# Patient Record
Sex: Male | Born: 1995 | Race: Black or African American | Hispanic: No | Marital: Single | Smoking: Current every day smoker
Health system: Southern US, Community
[De-identification: ages and names within clinical notes are randomized; demographics above are authoritative.]

## PROBLEM LIST (undated history)

## (undated) DIAGNOSIS — G43909 Migraine, unspecified, not intractable, without status migrainosus: Secondary | ICD-10-CM

---
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
## 2015-08-01 ENCOUNTER — Encounter: Payer: Self-pay | Admitting: Emergency Medicine

## 2015-08-01 ENCOUNTER — Emergency Department
Admission: EM | Admit: 2015-08-01 | Discharge: 2015-08-01 | Disposition: A | Discharge status: Home / Self Care | Payer: Medicaid Other | Attending: Emergency Medicine | Admitting: Emergency Medicine

## 2015-08-01 DIAGNOSIS — R51 Headache: Secondary | ICD-10-CM | POA: Diagnosis present

## 2015-08-01 DIAGNOSIS — R062 Wheezing: Secondary | ICD-10-CM | POA: Insufficient documentation

## 2015-08-01 DIAGNOSIS — G44229 Chronic tension-type headache, not intractable: Secondary | ICD-10-CM

## 2015-08-01 DIAGNOSIS — Z720 Tobacco use: Secondary | ICD-10-CM | POA: Insufficient documentation

## 2015-08-01 DIAGNOSIS — L309 Dermatitis, unspecified: Secondary | ICD-10-CM

## 2015-08-01 MED ORDER — IMITREX 50 MG PO TABS: 50.0000 mg | ORAL_TABLET | Freq: Once | ORAL | Status: DC | PRN

## 2015-08-01 MED ORDER — TRIAMCINOLONE ACETONIDE 0.5 % EX OINT: 1.0000 "application " | TOPICAL_OINTMENT | Freq: Two times a day (BID) | CUTANEOUS | Status: DC

## 2015-08-01 NOTE — ED Notes (Signed)
Pt to ed with c/o headache that has been occuring daily x 2 weeks.  Pt states he is able to take IBU and the pain resolves but [facility] that he should come to er for eval.  Pt denies headache at this time.  Alert and oriented, skin warm and dry and pt appears in nad.

## 2015-08-01 NOTE — ED Provider Notes (Signed)
CSN: [id number]     Arrival date & time 08/01/15  1237 History   First MD Initiated Contact with Patient 08/01/15 1253     Chief Complaint  Patient presents with  . Migraine    HPI Comments: 19 year old male presents today complaining of headaches off and on for the past 2 weeks. He is currently residing in a homeless shelter and his sponsor recommended he come to the hospital to be evaluated. He reports the headaches start in his occiput and also affect his temples at times. The pain is throbbing but is relieved by ibuprofen. He does not have a headache at present. He also experiences photophobia with these headaches, no nausea, vomiting or phonophobia. He has never been treated for headaches in the past.  Patient also concerned about eczema he has had since he is a child. The only thing he is applying to it currently is Vaseline. He requested prescription for the rash that is on his neck and bilateral arms.  Patient is a 19 y.o. male presenting with migraines.  Migraine Associated symptoms include headaches and a rash. Pertinent negatives include no fever, neck pain or weakness.    History reviewed. No pertinent past medical history. History reviewed. No pertinent past surgical history. History reviewed. No pertinent family history. Social History  Substance Use Topics  . Smoking status: Current Every Day Smoker  . Smokeless tobacco: None  . Alcohol Use: No    Review of Systems  Constitutional: Negative for fever.  Eyes: Positive for photophobia. Negative for pain.  Musculoskeletal: Negative for neck pain and neck stiffness.  Skin: Positive for rash.  Neurological: Positive for headaches. Negative for dizziness, weakness and light-headedness.  All other systems reviewed and are negative.     Allergies  Review of patient's allergies indicates no known allergies.  Home Medications   Prior to Admission medications   Medication Sig Start Date End Date Taking? Authorizing  Provider  IMITREX 50 MG tablet Take 1 tablet (50 mg total) by mouth once as needed for migraine. May repeat in 2 hours if headache persists or recurs. Max 2 doses in one day 08/01/15 07/31/16  Luvenia Redden, PA-C  triamcinolone ointment (KENALOG) 0.5 % Apply 1 application topically 2 (two) times daily. 08/01/15   Wilber Oliphant V, PA-C   BP 119/42 mmHg  Pulse 91  Temp(Src) 98.4 F (36.9 C) (Oral)  Resp 20  Ht  (1.651 m)  Wt 116 lb (52.617 kg)  BMI 19.30 kg/m2  SpO2 100% Physical Exam  Constitutional: He is oriented to person, place, and time. Vital signs are normal. He appears well-developed and well-nourished. He is active.  Non-toxic appearance. He does not have a sickly appearance. He does not appear ill.  HENT:  Head: Normocephalic and atraumatic.  Right Ear: Tympanic membrane and external ear normal.  Left Ear: Tympanic membrane and external ear normal.  Nose: Nose normal.  Mouth/Throat: Uvula is midline, oropharynx is clear and moist and mucous membranes are normal.  Eyes: Conjunctivae and EOM are normal. Pupils are equal, round, and reactive to light.  No photophobia  Neck: Trachea normal, normal range of motion, full passive range of motion without pain and phonation normal. Neck supple. No spinous process tenderness and no muscular tenderness present. No Brudzinski's sign and no Kernig's sign noted.  Cardiovascular: Normal rate, regular rhythm, normal heart sounds and intact distal pulses.   Pulmonary/Chest: Effort normal. He has wheezes. He has no rales. He exhibits no tenderness.  Musculoskeletal:  Normal range of motion.  Neurological: He is alert and oriented to person, place, and time.  Skin: Skin is warm and dry. Rash noted.  Erythematous, dry and peeling rash noted to neck,bilateral antecubital fossa  Psychiatric: He has a normal mood and affect. His behavior is normal. Judgment and thought content normal.  Nursing note and vitals reviewed.   ED Course  Procedures  (including critical care time) Labs Review Labs Reviewed - No data to display  Imaging Review No results found. I have personally reviewed and evaluated these images and lab results as part of my medical decision-making.   EKG Interpretation None      MDM  Headaches sound like tension headache. May be due to positioning/posturing. Can continue motrin if this is effective. RX for imitrex  BID as needed in addition to motrin. Triamcinolone BId to rash as needed.  Final diagnoses:  Eczema  Chronic tension-type headache, not intractable      Luvenia Redden, PA-C 08/01/15 1327  Myrna Blazer, MD 08/01/15 1531

## 2017-01-01 ENCOUNTER — Emergency Department
Admission: EM | Admit: 2017-01-01 | Discharge: 2017-01-01 | Disposition: A | Discharge status: Home / Self Care | Payer: Medicaid Other | Attending: Emergency Medicine | Admitting: Emergency Medicine

## 2017-01-01 ENCOUNTER — Encounter: Payer: Self-pay | Admitting: Emergency Medicine

## 2017-01-01 DIAGNOSIS — F17200 Nicotine dependence, unspecified, uncomplicated: Secondary | ICD-10-CM | POA: Insufficient documentation

## 2017-01-01 DIAGNOSIS — K2900 Acute gastritis without bleeding: Secondary | ICD-10-CM | POA: Insufficient documentation

## 2017-01-01 DIAGNOSIS — Z79899 Other long term (current) drug therapy: Secondary | ICD-10-CM | POA: Insufficient documentation

## 2017-01-01 LAB — CBC
HEMATOCRIT: 40.4 % (ref 40.0–52.0)
Hemoglobin: 13.9 g/dL (ref 13.0–18.0)
MCH: 30.9 pg (ref 26.0–34.0)
MCHC: 34.4 g/dL (ref 32.0–36.0)
MCV: 89.7 fL (ref 80.0–100.0)
Platelets: 219 10*3/uL (ref 150–440)
RBC: 4.51 MIL/uL (ref 4.40–5.90)
RDW: 13.6 % (ref 11.5–14.5)
WBC: 3.9 10*3/uL (ref 3.8–10.6)

## 2017-01-01 LAB — COMPREHENSIVE METABOLIC PANEL
ALT: 10 U/L — ABNORMAL LOW (ref 17–63)
AST: 18 U/L (ref 15–41)
Albumin: 4.2 g/dL (ref 3.5–5.0)
Alkaline Phosphatase: 51 U/L (ref 38–126)
Anion gap: 7 (ref 5–15)
BILIRUBIN TOTAL: 0.9 mg/dL (ref 0.3–1.2)
BUN: 14 mg/dL (ref 6–20)
CO2: 24 mmol/L (ref 22–32)
Calcium: 9 mg/dL (ref 8.9–10.3)
Chloride: 108 mmol/L (ref 101–111)
Creatinine, Ser: 0.9 mg/dL (ref 0.61–1.24)
Glucose, Bld: 93 mg/dL (ref 65–99)
POTASSIUM: 3.6 mmol/L (ref 3.5–5.1)
Sodium: 139 mmol/L (ref 135–145)
TOTAL PROTEIN: 6.6 g/dL (ref 6.5–8.1)

## 2017-01-01 LAB — LIPASE, BLOOD: LIPASE: 41 U/L (ref 11–51)

## 2017-01-01 MED ORDER — ONDANSETRON 4 MG PO TBDP: 4.0000 mg | ORAL_TABLET | Freq: Three times a day (TID) | ORAL | 0 refills | Status: DC | PRN

## 2017-01-01 MED ORDER — ONDANSETRON 4 MG PO TBDP
4.0000 mg | ORAL_TABLET | Freq: Once | ORAL | Status: AC
  Administered 2017-01-01: 4 mg via ORAL
  Filled 2017-01-01: qty 1

## 2017-01-01 NOTE — ED Notes (Signed)
AAOx3.  Skin warm and dry.  Patient states "I feel a little better".

## 2017-01-01 NOTE — ED Provider Notes (Signed)
[facility] Emergency Department Provider Note   ____________________________________________    I have reviewed the triage vital signs and the nursing notes.   HISTORY  Chief Complaint Nausea and vomiting    HPI Craig Ritter is a 21 y.o. male who presents with nausea vomiting and mild epigastric discomfort. Patient reports yesterday morning he felt nauseous after eating breakfast, he vomited and then felt better for most of the day. This morning he woke up again feeling nauseous and has vomited 4 times this morning. He complains of mild epigastric discomfort. Does report taking 2 Advil yesterday. Does not drink daily. Does smoke cigarettes.   History reviewed. No pertinent past medical history.  There are no active problems to display for this patient.   History reviewed. No pertinent surgical history.  Prior to Admission medications   Medication Sig Start Date End Date Taking? Authorizing Provider  IMITREX 50 MG tablet Take 1 tablet (50 mg total) by mouth once as needed for migraine. May repeat in 2 hours if headache persists or recurs. Max 2 doses in one day 08/01/15 07/31/16  Christella Scheuermann, PA-C  ondansetron (ZOFRAN ODT) 4 MG disintegrating tablet Take 1 tablet (4 mg total) by mouth every 8 (eight) hours as needed for nausea or vomiting. 01/01/17   Jene Every, MD  triamcinolone ointment (KENALOG) 0.5 % Apply 1 application topically 2 (two) times daily. 08/01/15   Christella Scheuermann, PA-C     Allergies Patient has no known allergies.  No family history on file.  Social History Social History  Substance Use Topics  . Smoking status: Current Every Day Smoker  . Smokeless tobacco: Never Used  . Alcohol use No    Review of Systems  Constitutional: No fever/chills Eyes: No visual changes.  ENT: No sore throat. Cardiovascular: Denies chest pain. Respiratory: Denies shortness of breath. Gastrointestinal: As above . Musculoskeletal:  Negative for back pain. Skin: Negative for rash. Neurological: Negative for headaches   10-point ROS otherwise negative.  ____________________________________________   PHYSICAL EXAM:  VITAL SIGNS: ED Triage Vitals  Enc Vitals Group     BP 01/01/17 0655 110/65     Pulse Rate 01/01/17 0655 72     Resp 01/01/17 0655 18     Temp 01/01/17 0655 97.5 F (36.4 C)     Temp Source 01/01/17 0655 Oral     SpO2 01/01/17 0655 100 %     Weight 01/01/17 0655 122 lb (55.3 kg)     Height 01/01/17 0655 5\' 4"  (1.626 m)     Head Circumference --      Peak Flow --      Pain Score 01/01/17 0656 6     Pain Loc --      Pain Edu? --      Excl. in GC? --     Constitutional: Alert and oriented. No acute distress. Pleasant and interactive Eyes: Conjunctivae are normal.   Nose: No congestion/rhinnorhea. Mouth/Throat: Mucous membranes are moist.    Cardiovascular: Normal rate, regular rhythm. Grossly normal heart sounds.  Good peripheral circulation. Respiratory: Normal respiratory effort.  No retractions. Lungs CTAB. Gastrointestinal: Soft and nontender. No distention.  No CVA tenderness.  Musculoskeletal: No lower extremity tenderness nor edema.  Warm and well perfused Neurologic:  Normal speech and language. No gross focal neurologic deficits are appreciated.  Skin:  Skin is warm, dry and intact. No rash noted. Psychiatric: Mood and affect are normal. Speech and behavior are normal.  ____________________________________________   LABS (all labs ordered are listed, but only abnormal results are displayed)  Labs Reviewed  COMPREHENSIVE METABOLIC PANEL - Abnormal; Notable for the following:       Result Value   ALT 10 (*)    All other components within normal limits  CBC  LIPASE, BLOOD    ____________________________________________  EKG  None ____________________________________________  RADIOLOGY  None ____________________________________________   PROCEDURES  Procedure(s) performed: No    Critical Care performed: No ____________________________________________   INITIAL IMPRESSION / ASSESSMENT AND PLAN / ED COURSE  Pertinent labs & imaging results that were available during my care of the patient were reviewed by me and considered in my medical decision making (see chart for details).  Patient well-appearing with reassuring exam. No tenderness palpation of the abdomen. No distention. Mild nausea this morning. Will treat with by mouth Zofran, check labs and reevaluate. Suspect gastritis, likely viral.    ----------------------------------------- 9:30 AM on 01/01/2017 -----------------------------------------  Patient reports his nausea has resolved. Lab work is reassuring. Exam remains benign. Recommend outpatient follow-up as needed. Return precautions discussed._ ___________________________________________   FINAL CLINICAL IMPRESSION(S) / ED DIAGNOSES  Final diagnoses:  Acute gastritis without hemorrhage, unspecified gastritis type      NEW MEDICATIONS STARTED DURING THIS VISIT:  Discharge Medication List as of 01/01/2017  9:20 AM    START taking these medications   Details  ondansetron (ZOFRAN ODT) 4 MG disintegrating tablet Take 1 tablet (4 mg total) by mouth every 8 (eight) hours as needed for nausea or vomiting., Starting Wed 01/01/2017, Print         Note:  This document was prepared using Dragon voice recognition software and may include unintentional dictation errors.    Jene Everyobert Finnlee Silvernail, MD 01/01/17 0930

## 2017-01-01 NOTE — ED Notes (Signed)
AAOx3.  Skin warm and dry.  NAD 

## 2017-01-01 NOTE — ED Triage Notes (Addendum)
Patient ambulatory to triage with steady gait, without difficulty or distress noted; pt reports N/V and mid abd pain since eating a banana yesterday; took 2 advil which made pain worse

## 2017-01-01 NOTE — ED Notes (Signed)
Patient denies pain and is resting comfortably.  

## 2019-02-28 ENCOUNTER — Encounter: Payer: Self-pay | Admitting: Emergency Medicine

## 2019-02-28 ENCOUNTER — Emergency Department: Payer: Self-pay

## 2019-02-28 ENCOUNTER — Other Ambulatory Visit: Payer: Self-pay

## 2019-02-28 ENCOUNTER — Emergency Department
Admission: EM | Admit: 2019-02-28 | Discharge: 2019-02-28 | Disposition: A | Discharge status: Home / Self Care | Payer: Self-pay | Attending: Emergency Medicine | Admitting: Emergency Medicine

## 2019-02-28 DIAGNOSIS — R079 Chest pain, unspecified: Secondary | ICD-10-CM | POA: Insufficient documentation

## 2019-02-28 DIAGNOSIS — F17210 Nicotine dependence, cigarettes, uncomplicated: Secondary | ICD-10-CM | POA: Insufficient documentation

## 2019-02-28 DIAGNOSIS — R110 Nausea: Secondary | ICD-10-CM | POA: Insufficient documentation

## 2019-02-28 LAB — CBC
HCT: 44.2 % (ref 39.0–52.0)
Hemoglobin: 15.2 g/dL (ref 13.0–17.0)
MCH: 31 pg (ref 26.0–34.0)
MCHC: 34.4 g/dL (ref 30.0–36.0)
MCV: 90 fL (ref 80.0–100.0)
Platelets: 195 10*3/uL (ref 150–400)
RBC: 4.91 MIL/uL (ref 4.22–5.81)
RDW: 11.9 % (ref 11.5–15.5)
WBC: 4.1 10*3/uL (ref 4.0–10.5)
nRBC: 0 % (ref 0.0–0.2)

## 2019-02-28 LAB — BASIC METABOLIC PANEL
Anion gap: 13 (ref 5–15)
BUN: 11 mg/dL (ref 6–20)
CO2: 25 mmol/L (ref 22–32)
Calcium: 9.6 mg/dL (ref 8.9–10.3)
Chloride: 101 mmol/L (ref 98–111)
Creatinine, Ser: 1.07 mg/dL (ref 0.61–1.24)
GFR calc Af Amer: >60 mL/min (ref >60)
GFR calc non Af Amer: >60 mL/min (ref >60)
Glucose, Bld: 103 mg/dL — ABNORMAL HIGH (ref 70–99)
Potassium: 3.4 mmol/L — ABNORMAL LOW (ref 3.5–5.1)
Sodium: 139 mmol/L (ref 135–145)

## 2019-02-28 LAB — TROPONIN I: Troponin I: <0.03 ng/mL (ref <0.03)

## 2019-02-28 MED ORDER — ONDANSETRON 4 MG PO TBDP
4.0000 mg | ORAL_TABLET | Freq: Once | ORAL | Status: AC | PRN
  Administered 2019-02-28: 4 mg via ORAL
  Filled 2019-02-28: qty 1

## 2019-02-28 MED ORDER — ONDANSETRON 4 MG PO TBDP: 4.0000 mg | ORAL_TABLET | Freq: Three times a day (TID) | ORAL | 0 refills | Status: DC | PRN

## 2019-02-28 NOTE — ED Triage Notes (Signed)
Pt arrives with complaints of midsternum chest pain that pt reports as a tight sensation. Pain started last night and the intensity is intermittent. Pt also reports nausea/diarrhea that started today.

## 2019-02-28 NOTE — ED Provider Notes (Signed)
[facility] Emergency Department Provider Note  Time seen: 1:24 PM  I have reviewed the triage vital signs and the nursing notes.   HISTORY  Chief Complaint Chest Pain    HPI Craig Rorie. is a 23 y.o. male with a past medical history of anxiety, presents to the emergency department for chest discomfort and nausea.  According to the patient since yesterday he has been experiencing intermittent chest discomfort he describes more as a mild tightness sensation.  Also states he has been nauseated since yesterday had one episode of diarrhea today.  Denies any abdominal pain.  No fever cough congestion or shortness of breath.    History reviewed. No pertinent past medical history.  There are no active problems to display for this patient.   History reviewed. No pertinent surgical history.  Prior to Admission medications   Medication Sig Start Date End Date Taking? Authorizing Provider  IMITREX 50 MG tablet Take 1 tablet (50 mg total) by mouth once as needed for migraine. May repeat in 2 hours if headache persists or recurs. Max 2 doses in one day 08/01/15 07/31/16  Christella Scheuermann, PA-C  ondansetron (ZOFRAN ODT) 4 MG disintegrating tablet Take 1 tablet (4 mg total) by mouth every 8 (eight) hours as needed for nausea or vomiting. 01/01/17   Jene Every, MD  triamcinolone ointment (KENALOG) 0.5 % Apply 1 application topically 2 (two) times daily. 08/01/15   Christella Scheuermann, PA-C    No Known Allergies  No family history on file.  Social History Social History   Tobacco Use  . Smoking status: Current Every Day Smoker  . Smokeless tobacco: Never Used  Substance Use Topics  . Alcohol use: No  . Drug use: No    Review of Systems Constitutional: Negative for fever. Cardiovascular: Mild chest tightness. Respiratory: Negative for shortness of breath. Gastrointestinal: Negative for abdominal pain.  Positive for nausea x2 days, one episode of diarrhea.   Negative for vomiting. Genitourinary: Negative for urinary compaints Musculoskeletal: Negative for musculoskeletal complaints Skin: Negative for skin complaints  Neurological: Negative for headache All other ROS negative  ____________________________________________   PHYSICAL EXAM:  VITAL SIGNS: ED Triage Vitals  Enc Vitals Group     BP 02/28/19 1146 (!) 121/55     Pulse Rate 02/28/19 1146 81     Resp 02/28/19 1146 15     Temp 02/28/19 1146 97.8 F (36.6 C)     Temp Source 02/28/19 1146 Oral     SpO2 02/28/19 1146 100 %     Weight 02/28/19 1145 115 lb (52.2 kg)     Height 02/28/19 1145 5\' 6"  (1.676 m)     Head Circumference --      Peak Flow --      Pain Score 02/28/19 1145 5     Pain Loc --      Pain Edu? --      Excl. in GC? --     Constitutional: Alert and oriented. Well appearing and in no distress. Eyes: Normal exam ENT      Head: Normocephalic and atraumatic.      Mouth/Throat: Mucous membranes are moist. Cardiovascular: Normal rate, regular rhythm.  Respiratory: Normal respiratory effort without tachypnea nor retractions. Breath sounds are clear Gastrointestinal: Soft and nontender. No distention.   Musculoskeletal: Nontender with normal range of motion in all extremities.  Neurologic:  Normal speech and language. No gross focal neurologic deficits Skin:  Skin is warm, dry and  intact.  Psychiatric: Mood and affect are normal.   ____________________________________________    EKG  EKG viewed and interpreted by myself shows a normal sinus rhythm 87 bpm with a narrow QRS, normal axis, normal intervals, no concerning ST changes.  RSR pattern most consistent with right bundle branch block.  ____________________________________________    RADIOLOGY  Chest x-ray negative  ____________________________________________   INITIAL IMPRESSION / ASSESSMENT AND PLAN / ED COURSE  Pertinent labs & imaging results that were available during my care of the patient  were reviewed by me and considered in my medical decision making (see chart for details).   Patient presents to the emergency department for intermittent chest discomfort described as a mild tightness as well as nausea with one episode of diarrhea today.  Differential would include gastroenteritis, ACS, anxiety.  Patient's work-up today is overall reassuring including a negative chest x-ray, reassuring EKG and normal labs including a negative troponin.  Overall the patient appears extremely well my physical exam, no distress.  Completely benign abdominal exam.  We will discharge with short course of Zofran, supportive care at home including plenty of fluids and rest.  Patient agreeable to plan of care.  Discussed my normal chest pain return precautions.  Craig ItoKenneth S Blandon Jr. was evaluated in Emergency Department on 02/28/2019 for the symptoms described in the history of present illness. He was evaluated in the context of the global COVID-19 pandemic, which necessitated consideration that the patient might be at risk for infection with the SARS-CoV-2 virus that causes COVID-19. Institutional protocols and algorithms that pertain to the evaluation of patients at risk for COVID-19 are in a state of rapid change based on information released by regulatory bodies including the CDC and federal and state organizations. These policies and algorithms were followed during the patient's care in the ED.  ____________________________________________   FINAL CLINICAL IMPRESSION(S) / ED DIAGNOSES  Chest pain Nausea   Minna AntisPaduchowski, Ayo Guarino, MD 02/28/19 1326

## 2019-03-01 ENCOUNTER — Other Ambulatory Visit: Payer: Self-pay

## 2019-03-01 ENCOUNTER — Emergency Department
Admission: EM | Admit: 2019-03-01 | Discharge: 2019-03-01 | Disposition: A | Discharge status: Home / Self Care | Payer: Self-pay | Attending: Emergency Medicine | Admitting: Emergency Medicine

## 2019-03-01 DIAGNOSIS — F419 Anxiety disorder, unspecified: Secondary | ICD-10-CM | POA: Insufficient documentation

## 2019-03-01 DIAGNOSIS — F17210 Nicotine dependence, cigarettes, uncomplicated: Secondary | ICD-10-CM | POA: Insufficient documentation

## 2019-03-01 DIAGNOSIS — R0789 Other chest pain: Secondary | ICD-10-CM | POA: Insufficient documentation

## 2019-03-01 DIAGNOSIS — Z79899 Other long term (current) drug therapy: Secondary | ICD-10-CM | POA: Insufficient documentation

## 2019-03-01 LAB — CBC
HCT: 42.8 % (ref 39.0–52.0)
Hemoglobin: 14.7 g/dL (ref 13.0–17.0)
MCH: 31.3 pg (ref 26.0–34.0)
MCHC: 34.3 g/dL (ref 30.0–36.0)
MCV: 91.1 fL (ref 80.0–100.0)
Platelets: 216 10*3/uL (ref 150–400)
RBC: 4.7 MIL/uL (ref 4.22–5.81)
RDW: 11.8 % (ref 11.5–15.5)
WBC: 4.9 10*3/uL (ref 4.0–10.5)
nRBC: 0 % (ref 0.0–0.2)

## 2019-03-01 LAB — BASIC METABOLIC PANEL
Anion gap: 13 (ref 5–15)
BUN: 14 mg/dL (ref 6–20)
CO2: 25 mmol/L (ref 22–32)
Calcium: 9.6 mg/dL (ref 8.9–10.3)
Chloride: 99 mmol/L (ref 98–111)
Creatinine, Ser: 1.09 mg/dL (ref 0.61–1.24)
GFR calc Af Amer: >60 mL/min (ref >60)
GFR calc non Af Amer: >60 mL/min (ref >60)
Glucose, Bld: 99 mg/dL (ref 70–99)
Potassium: 3.5 mmol/L (ref 3.5–5.1)
Sodium: 137 mmol/L (ref 135–145)

## 2019-03-01 LAB — TROPONIN I: Troponin I: <0.03 ng/mL (ref <0.03)

## 2019-03-01 MED ORDER — HYDROXYZINE HCL 25 MG PO TABS: 25.0000 mg | ORAL_TABLET | Freq: Three times a day (TID) | ORAL | 0 refills | Status: DC | PRN

## 2019-03-01 MED ORDER — SODIUM CHLORIDE 0.9% FLUSH: 3.0000 mL | Freq: Once | INTRAVENOUS | Status: DC

## 2019-03-01 MED ORDER — HYDROXYZINE HCL 25 MG PO TABS
50.0000 mg | ORAL_TABLET | Freq: Once | ORAL | Status: AC
  Administered 2019-03-01: 50 mg via ORAL
  Filled 2019-03-01: qty 2

## 2019-03-01 NOTE — ED Notes (Signed)
Pt A&Ox4, no acute distress. Pt reports he thinks smoking a cigarette made his symptoms worse

## 2019-03-01 NOTE — ED Provider Notes (Signed)
[facility] Emergency Department Provider Note  Time seen: 7:26 PM  I have reviewed the triage vital signs and the nursing notes.   HISTORY  Chief Complaint Chest Pain and Emesis    HPI Craig Ritter. is a 23 y.o. male with a past medical history of anxiety presents emergency department for chest tightness.  Patient was seen in the emergency department yesterday by myself for similar symptoms.  Patient states since going home he continues to have chest tightness, states it felt much worse after he smoked a cigarette today.  Patient has anxious in appearance, admits himself that he thinks this could be anxiety.  I prescribed the patient nausea medication yesterday for his nausea, he did not fill it yet.  Denies any nausea today.  States a tightness sensation in the chest which has been ongoing since yesterday.  Denies any shortness of breath denies any cough or fever.    History reviewed. No pertinent past medical history.  There are no active problems to display for this patient.   History reviewed. No pertinent surgical history.  Prior to Admission medications   Medication Sig Start Date End Date Taking? Authorizing Provider  IMITREX 50 MG tablet Take 1 tablet (50 mg total) by mouth once as needed for migraine. May repeat in 2 hours if headache persists or recurs. Max 2 doses in one day 08/01/15 07/31/16  Christella Scheuermann, PA-C  ondansetron (ZOFRAN ODT) 4 MG disintegrating tablet Take 1 tablet (4 mg total) by mouth every 8 (eight) hours as needed for nausea or vomiting. 02/28/19   Minna Antis, MD  triamcinolone ointment (KENALOG) 0.5 % Apply 1 application topically 2 (two) times daily. 08/01/15   Christella Scheuermann, PA-C    No Known Allergies  No family history on file.  Social History Social History   Tobacco Use  . Smoking status: Current Every Day Smoker  . Smokeless tobacco: Never Used  Substance Use Topics  . Alcohol use: No  . Drug use:  No    Review of Systems Constitutional: Negative for fever. Cardiovascular: Mild chest tightness Respiratory: Negative for shortness of breath. Gastrointestinal: Negative for abdominal pain, vomiting  Musculoskeletal: Negative for musculoskeletal complaints Skin: Negative for skin complaints  Neurological: Negative for headache All other ROS negative  ____________________________________________   PHYSICAL EXAM:  VITAL SIGNS: ED Triage Vitals  Enc Vitals Group     BP 03/01/19 1745 (!) 120/57     Pulse Rate 03/01/19 1745 76     Resp 03/01/19 1745 16     Temp 03/01/19 1745 98.2 F (36.8 C)     Temp Source 03/01/19 1745 Oral     SpO2 03/01/19 1745 100 %     Weight 03/01/19 1724 115 lb (52.2 kg)     Height 03/01/19 1724 5\' 6"  (1.676 m)     Head Circumference --      Peak Flow --      Pain Score 03/01/19 1858 0     Pain Loc --      Pain Edu? --      Excl. in GC? --    Constitutional: Alert and oriented. Well appearing, mildly anxious in appearance. Eyes: Normal exam ENT      Head: Normocephalic and atraumatic.      Mouth/Throat: Mucous membranes are moist. Cardiovascular: Normal rate, regular rhythm Respiratory: Normal respiratory effort without tachypnea nor retractions. Breath sounds are clear Gastrointestinal: Soft and nontender. No distention.   Musculoskeletal: Nontender  with normal range of motion in all extremities.  Neurologic:  Normal speech and language. No gross focal neurologic deficits  Skin:  Skin is warm, dry and intact.  Psychiatric: Mood and affect are normal.  ____________________________________________    EKG  EKG viewed and interpreted by myself shows a normal sinus rhythm at 70 bpm, narrow QRS, mild right axis deviation.  Largely normal intervals, no concerning ST changes.  ____________________________________________   INITIAL IMPRESSION / ASSESSMENT AND PLAN / ED COURSE  Pertinent labs & imaging results that were available during my  care of the patient were reviewed by me and considered in my medical decision making (see chart for details).   Patient presents to the emergency department for chest tightness ongoing since yesterday.  Patient was seen by myself yesterday as well as today.  Overall the patient appears extremely well, normal physical exam including heart sounds and lung sounds.  Patient is EKG continues to appear very well.  Repeat labs including cardiac enzymes remain negative.  Highly suspect anxiety to be contributing to the patient's symptoms.  I discussed with the patient a trial of hydroxyzine, I also discussed following up with a primary care doctor.  I discussed my normal chest pain return precautions.  Patient agreeable to plan of care.  Richelle ItoKenneth S Cervantes Jr. was evaluated in Emergency Department on 03/01/2019 for the symptoms described in the history of present illness. He was evaluated in the context of the global COVID-19 pandemic, which necessitated consideration that the patient might be at risk for infection with the SARS-CoV-2 virus that causes COVID-19. Institutional protocols and algorithms that pertain to the evaluation of patients at risk for COVID-19 are in a state of rapid change based on information released by regulatory bodies including the CDC and federal and state organizations. These policies and algorithms were followed during the patient's care in the ED.  ____________________________________________   FINAL CLINICAL IMPRESSION(S) / ED DIAGNOSES  Chest tightness   Minna AntisPaduchowski, Jonique Kulig, MD 03/01/19 1930

## 2019-03-01 NOTE — ED Triage Notes (Signed)
Pt comes into the ED via EMS from home with c/o chest pain with N/V. Pt was seen here yesterday for the same and given a prescription for zofran, states he is not able to afford to get the zofran filled. Pt is in NAD on arrival,. EMS reports pt walking down 3 flights of stairs with no distress with them PTA.

## 2019-05-28 ENCOUNTER — Emergency Department: Payer: Self-pay

## 2019-05-28 ENCOUNTER — Emergency Department
Admission: EM | Admit: 2019-05-28 | Discharge: 2019-05-28 | Disposition: A | Discharge status: Home / Self Care | Payer: Self-pay | Attending: Student in an Organized Health Care Education/Training Program | Admitting: Student in an Organized Health Care Education/Training Program

## 2019-05-28 DIAGNOSIS — Z79899 Other long term (current) drug therapy: Secondary | ICD-10-CM | POA: Insufficient documentation

## 2019-05-28 DIAGNOSIS — W010XXA Fall on same level from slipping, tripping and stumbling without subsequent striking against object, initial encounter: Secondary | ICD-10-CM | POA: Insufficient documentation

## 2019-05-28 DIAGNOSIS — Y9367 Activity, basketball: Secondary | ICD-10-CM | POA: Insufficient documentation

## 2019-05-28 DIAGNOSIS — Y929 Unspecified place or not applicable: Secondary | ICD-10-CM | POA: Insufficient documentation

## 2019-05-28 DIAGNOSIS — F17210 Nicotine dependence, cigarettes, uncomplicated: Secondary | ICD-10-CM | POA: Insufficient documentation

## 2019-05-28 DIAGNOSIS — Y999 Unspecified external cause status: Secondary | ICD-10-CM | POA: Insufficient documentation

## 2019-05-28 DIAGNOSIS — S62336A Displaced fracture of neck of fifth metacarpal bone, right hand, initial encounter for closed fracture: Secondary | ICD-10-CM | POA: Insufficient documentation

## 2019-05-28 MED ORDER — HYDROCODONE-ACETAMINOPHEN 5-325 MG PO TABS
1.0000 | ORAL_TABLET | Freq: Once | ORAL | Status: AC
  Administered 2019-05-28: 1 via ORAL
  Filled 2019-05-28: qty 1

## 2019-05-28 NOTE — ED Provider Notes (Signed)
[facility] Emergency Department Provider Note ____________________________________________  Time seen: Approximately 8:28 PM  I have reviewed the triage vital signs and the nursing notes.   HISTORY  Chief Complaint Hand Pain    HPI Craig Ritter. is a 23 y.o. male who presents to the emergency department for evaluation and treatment of right hand pain after mechanical, non-syncopal fall while playing basketball tonight. He is right hand dominant. No previous fractures. No alleviating measures prior to arrival.  History reviewed. No pertinent past medical history.  There are no active problems to display for this patient.   History reviewed. No pertinent surgical history.  Prior to Admission medications   Medication Sig Start Date End Date Taking? Authorizing Provider  hydrOXYzine (ATARAX/VISTARIL) 25 MG tablet Take 1 tablet (25 mg total) by mouth 3 (three) times daily as needed for anxiety. 03/01/19   Harvest Dark, MD  IMITREX 50 MG tablet Take 1 tablet (50 mg total) by mouth once as needed for migraine. May repeat in 2 hours if headache persists or recurs. Max 2 doses in one day 08/01/15 07/31/16  Harvest Dark, PA-C  ondansetron (ZOFRAN ODT) 4 MG disintegrating tablet Take 1 tablet (4 mg total) by mouth every 8 (eight) hours as needed for nausea or vomiting. 02/28/19   Harvest Dark, MD  triamcinolone ointment (KENALOG) 0.5 % Apply 1 application topically 2 (two) times daily. 08/01/15   Harvest Dark, PA-C    Allergies Fish allergy  No family history on file.  Social History Social History   Tobacco Use  . Smoking status: Current Every Day Smoker  . Smokeless tobacco: Never Used  Substance Use Topics  . Alcohol use: No  . Drug use: No    Review of Systems Constitutional: Negative for fever. Cardiovascular: Negative for chest pain. Respiratory: Negative for shortness of breath. Musculoskeletal: Positive for right hand  pain and swelling Skin: Negative for open wounds over the right hand.  Neurological: Negative for decrease in sensation  ____________________________________________   PHYSICAL EXAM:  VITAL SIGNS: ED Triage Vitals  Enc Vitals Group     BP 05/28/19 1935 (!) 126/102     Pulse Rate 05/28/19 1935 89     Resp 05/28/19 1935 18     Temp 05/28/19 1935 98.6 F (37 C)     Temp Source 05/28/19 1935 Oral     SpO2 05/28/19 1935 100 %     Weight 05/28/19 1936 120 lb (54.4 kg)     Height 05/28/19 1936 5\' 4"  (1.626 m)     Head Circumference --      Peak Flow --      Pain Score 05/28/19 1935 10     Pain Loc --      Pain Edu? --      Excl. in [place]? --     Constitutional: Alert and oriented. Well appearing and in no acute distress. Eyes: Conjunctivae are clear without discharge or drainage Head: Atraumatic Neck: Supple. Respiratory: No cough. Respirations are even and unlabored. Musculoskeletal: Limited ROM of the fifth finger on the right hand due to deformity, swelling, and pain. Neurologic: Motor and sensory function of the right hand is intact.  Skin: Swelling over the MCP of the right small finger.  Psychiatric: Affect and behavior are appropriate.  ____________________________________________   LABS (all labs ordered are listed, but only abnormal results are displayed)  Labs Reviewed - No data to display ____________________________________________  RADIOLOGY  Image of the right hand  shows anteriorly angulated, nondisplaced fracture of the distal right fifth metacarpal.  No dislocation noted. ____________________________________________   PROCEDURES  .Splint Application  Date/Time: 05/28/2019 8:48 PM Performed by: Chinita Pesterriplett, Cherica Heiden B, FNP Authorized by: Chinita Pesterriplett, Earnest Thalman B, FNP   Consent:    Consent obtained:  Verbal   Consent given by:  Patient   Risks discussed:  Numbness, pain and swelling Pre-procedure details:    Sensation:  Normal Procedure details:    Laterality:   Right   Location:  Hand   Splint type:  Ulnar gutter   Supplies:  Cotton padding, Ortho-Glass and elastic bandage Post-procedure details:    Pain:  Unchanged   Sensation:  Normal   Patient tolerance of procedure:  Tolerated well, no immediate complications    ____________________________________________   INITIAL IMPRESSION / ASSESSMENT AND PLAN / ED COURSE  Craig ItoKenneth S Baca Jr. is a 23 y.o. who presents to the emergency department for treatment and evaluation after injury of the right hand. Image shows a fracture of the distal right fifth metacarpal. He will be placed in an OCL and advised to call orthopedics for follow up.   He was also instructed to return to the emergency department for symptoms that change or worsen if unable schedule an appointment with orthopedics or primary care.  Medications  HYDROcodone-acetaminophen (NORCO/VICODIN) 5-325 MG per tablet 1 tablet (1 tablet Oral Given 05/28/19 2052)    Pertinent labs & imaging results that were available during my care of the patient were reviewed by me and considered in my medical decision making (see chart for details).  _________________________________________   FINAL CLINICAL IMPRESSION(S) / ED DIAGNOSES  Final diagnoses:  Closed displaced fracture of neck of fifth metacarpal bone of right hand, initial encounter    ED Discharge Orders    None       If controlled substance prescribed during this visit, 12 month history viewed on the [facility] prior to issuing an initial prescription for Schedule II or III opiod.   Chinita Pesterriplett, Myrene Bougher B, FNP 05/28/19 2055    Willy Eddyobinson, Patrick, MD 05/29/19 [phone]

## 2019-05-28 NOTE — ED Triage Notes (Signed)
Patient reports falling while playing basketball and catching himself with bilateral hands. Patient c/o right hand pain.

## 2019-10-01 ENCOUNTER — Emergency Department: Payer: Self-pay

## 2019-10-01 ENCOUNTER — Emergency Department
Admission: EM | Admit: 2019-10-01 | Discharge: 2019-10-01 | Disposition: A | Discharge status: Home / Self Care | Payer: Self-pay | Attending: Emergency Medicine | Admitting: Emergency Medicine

## 2019-10-01 ENCOUNTER — Other Ambulatory Visit: Payer: Self-pay

## 2019-10-01 DIAGNOSIS — Y92310 Basketball court as the place of occurrence of the external cause: Secondary | ICD-10-CM | POA: Insufficient documentation

## 2019-10-01 DIAGNOSIS — Z23 Encounter for immunization: Secondary | ICD-10-CM | POA: Insufficient documentation

## 2019-10-01 DIAGNOSIS — S00531A Contusion of lip, initial encounter: Secondary | ICD-10-CM | POA: Insufficient documentation

## 2019-10-01 DIAGNOSIS — Y9367 Activity, basketball: Secondary | ICD-10-CM | POA: Insufficient documentation

## 2019-10-01 DIAGNOSIS — Y999 Unspecified external cause status: Secondary | ICD-10-CM | POA: Insufficient documentation

## 2019-10-01 DIAGNOSIS — F17200 Nicotine dependence, unspecified, uncomplicated: Secondary | ICD-10-CM | POA: Insufficient documentation

## 2019-10-01 DIAGNOSIS — Z79899 Other long term (current) drug therapy: Secondary | ICD-10-CM | POA: Insufficient documentation

## 2019-10-01 DIAGNOSIS — S20211A Contusion of right front wall of thorax, initial encounter: Secondary | ICD-10-CM | POA: Insufficient documentation

## 2019-10-01 MED ORDER — TRAMADOL HCL 50 MG PO TABS: 50.0000 mg | ORAL_TABLET | Freq: Four times a day (QID) | ORAL | 0 refills | Status: DC | PRN

## 2019-10-01 MED ORDER — NAPROXEN 500 MG PO TABS
500.0000 mg | ORAL_TABLET | Freq: Once | ORAL | Status: AC
  Administered 2019-10-01: 19:00:00 500 mg via ORAL
  Filled 2019-10-01: qty 1

## 2019-10-01 MED ORDER — TETANUS-DIPHTH-ACELL PERTUSSIS 5-2.5-18.5 LF-MCG/0.5 IM SUSP
0.5000 mL | Freq: Once | INTRAMUSCULAR | Status: AC
  Administered 2019-10-01: 18:00:00 0.5 mL via INTRAMUSCULAR
  Filled 2019-10-01: qty 0.5

## 2019-10-01 MED ORDER — TRAMADOL HCL 50 MG PO TABS
50.0000 mg | ORAL_TABLET | Freq: Once | ORAL | Status: AC
  Administered 2019-10-01: 19:00:00 50 mg via ORAL
  Filled 2019-10-01: qty 1

## 2019-10-01 MED ORDER — NAPROXEN 500 MG PO TABS: 500.0000 mg | ORAL_TABLET | Freq: Two times a day (BID) | ORAL | 0 refills | Status: DC
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
## 2019-10-01 NOTE — Discharge Instructions (Signed)
Please force yourself to take deep breaths often throughout the day to prevent pneumonia.  Return to the ER for any symptom that changes or worsen if unable to schedule an appointment with primary care.

## 2019-10-01 NOTE — ED Notes (Addendum)
Aunt at bedside at this time as pt's ride home.
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
## 2019-12-13 ENCOUNTER — Emergency Department: Payer: Self-pay

## 2019-12-13 ENCOUNTER — Other Ambulatory Visit: Payer: Self-pay

## 2019-12-13 ENCOUNTER — Encounter: Payer: Self-pay | Admitting: Emergency Medicine

## 2019-12-13 ENCOUNTER — Emergency Department
Admission: EM | Admit: 2019-12-13 | Discharge: 2019-12-13 | Disposition: A | Discharge status: Home / Self Care | Payer: Self-pay | Attending: Emergency Medicine | Admitting: Emergency Medicine

## 2019-12-13 DIAGNOSIS — F17210 Nicotine dependence, cigarettes, uncomplicated: Secondary | ICD-10-CM | POA: Insufficient documentation

## 2019-12-13 DIAGNOSIS — R519 Headache, unspecified: Secondary | ICD-10-CM | POA: Insufficient documentation

## 2019-12-13 HISTORY — DX: Migraine, unspecified, not intractable, without status migrainosus: G43.909

## 2019-12-13 MED ORDER — BUTALBITAL-APAP-CAFFEINE 50-325-40 MG PO TABS: 1.0000 | ORAL_TABLET | Freq: Four times a day (QID) | ORAL | 0 refills | Status: AC | PRN

## 2019-12-13 MED ORDER — HYDROCODONE-ACETAMINOPHEN 5-325 MG PO TABS
1.0000 | ORAL_TABLET | Freq: Once | ORAL | Status: AC
  Administered 2019-12-13: 1 via ORAL
  Filled 2019-12-13: qty 1

## 2019-12-13 NOTE — ED Provider Notes (Signed)
[facility] Emergency Department Provider Note  ____________________________________________   First MD Initiated Contact with Patient 12/13/19 1208     (approximate)  I have reviewed the triage vital signs and the nursing notes.   HISTORY  Chief Complaint Headache   HPI Craig Ritter. is a 24 y.o. male presents to the ED with complaint of right-sided headache for 1 week.  Patient states that the pain feels like it is behind his eye and at the temporal area.  He states he has only taken an over-the-counter NSAID once for the past week.  He has been taking an unknown "sinus medicine" without any relief.  Patient denies any fever, chills, nausea or vomiting.  There is been no trauma to his head.  Patient states he gets a little relief when he goes outside in fresh air and his headache begins again after he returns inside the house.  He states 2 years ago he was told he had migraines but no work-up was done.  Currently rates his pain as an 8 out of 10.      Past Medical History:  Diagnosis Date  . Migraines     There are no problems to display for this patient.   History reviewed. No pertinent surgical history.  Prior to Admission medications   Medication Sig Start Date End Date Taking? Authorizing Provider  butalbital-acetaminophen-caffeine (FIORICET) [phone] MG tablet Take 1 tablet by mouth every 6 (six) hours as needed for headache. 12/13/19 12/12/20  Johnn Hai, PA-C    Allergies Fish allergy  History reviewed. No pertinent family history.  Social History Social History   Tobacco Use  . Smoking status: Current Every Day Smoker  . Smokeless tobacco: Never Used  Substance Use Topics  . Alcohol use: No  . Drug use: No    Review of Systems Constitutional: No fever/chills Eyes: No visual changes. ENT: No sore throat. Cardiovascular: Denies chest pain. Respiratory: Denies shortness of breath.  Negative for  cough. Gastrointestinal: No abdominal pain.  No nausea, no vomiting.  No diarrhea.  No constipation. Genitourinary: Negative for dysuria. Musculoskeletal: Negative for muscle aches. Skin: Negative for rash. Neurological: Positive for right-sided headaches, negative for focal weakness or numbness. ____________________________________________   PHYSICAL EXAM:  VITAL SIGNS: ED Triage Vitals  Enc Vitals Group     BP 12/13/19 1154 133/75     Pulse Rate 12/13/19 1154 98     Resp 12/13/19 1154 20     Temp 12/13/19 1154 98.5 F (36.9 C)     Temp Source 12/13/19 1154 Oral     SpO2 12/13/19 1154 99 %     Weight 12/13/19 1155 119 lb (54 kg)     Height 12/13/19 1155 5\' 4"  (1.626 m)     Head Circumference --      Peak Flow --      Pain Score 12/13/19 1154 8     Pain Loc --      Pain Edu? --      Excl. in [place]? --    Constitutional: Alert and oriented. Well appearing and in no acute distress.  No photophobia noted.  Patient is able to talk in complete sentences without any difficulty. Eyes: Conjunctivae are normal. PERRL. EOMI. Head: Atraumatic. Neck: No stridor.   Hematological/Lymphatic/Immunilogical: No cervical lymphadenopathy. Cardiovascular: Normal rate, regular rhythm. Grossly normal heart sounds.  Good peripheral circulation. Respiratory: Normal respiratory effort.  No retractions. Lungs CTAB. Gastrointestinal: Soft and nontender. No distention.  Musculoskeletal:  Patient is able move upper and lower extremities no difficulty.  Normal gait was noted. Neurologic:  Normal speech and language. No gross focal neurologic deficits are appreciated.  Cranial nerves II through XII grossly intact.  No gait instability. Skin:  Skin is warm, dry and intact.  Psychiatric: Mood and affect are normal. Speech and behavior are normal.  ____________________________________________   LABS (all labs ordered are listed, but only abnormal results are displayed)  Labs Reviewed - No data to  display  RADIOLOGY   Official radiology report(s): CT Head Wo Contrast  Result Date: 12/13/2019 CLINICAL DATA:  Right-sided headache for 1 week EXAM: CT HEAD WITHOUT CONTRAST TECHNIQUE: Contiguous axial images were obtained from the base of the skull through the vertex without intravenous contrast. COMPARISON:  None. FINDINGS: Brain: No evidence of acute infarction, hemorrhage, hydrocephalus, extra-axial collection or mass lesion/mass effect. Vascular: No hyperdense vessel or unexpected calcification. Skull: Normal. Negative for fracture or focal lesion. Sinuses/Orbits: No acute finding. Other: None. IMPRESSION: No acute intracranial pathology. No non-contrast CT findings to explain headache. Electronically Signed   By: Lauralyn Primes M.D.   On: 12/13/2019 13:20    ____________________________________________   PROCEDURES  Procedure(s) performed (including Critical Care):  Procedures   ____________________________________________   INITIAL IMPRESSION / ASSESSMENT AND PLAN / ED COURSE  As part of my medical decision making, I reviewed the following data within the electronic MEDICAL RECORD NUMBER Notes from prior ED visits and [place] Controlled Substance Database  24 year old male presents to the ED with complaint of right-sided headache for the last week.  Patient states that he was told years ago that he had migraines but there was never a work-up done.  He is anxious about tumors.  He denies any nausea, vomiting or photophobia.  Patient states his headache is improved when he goes outside but gets worse when he goes back and saw the house.  He has been taking "sinus medication" for the past week and took NSAIDs once without relief.  CT scan was reassuring and patient was given Norco which he states did help some prior to discharge.  Patient was instructed to obtain a PCP for further management and also for other medical problems.  A list of clinics was given to him.  He was discharged with a  prescription for Fioricet as needed for headache.  ____________________________________________   FINAL CLINICAL IMPRESSION(S) / ED DIAGNOSES  Final diagnoses:  Right-sided headache     ED Discharge Orders         Ordered    butalbital-acetaminophen-caffeine (FIORICET) 50-325-40 MG tablet  Every 6 hours PRN     12/13/19 1439           Note:  This document was prepared using Dragon voice recognition software and may include unintentional dictation errors.    Tommi Rumps, PA-C 12/13/19 1735    Sharman Cheek, MD 12/14/19 [id number]

## 2019-12-13 NOTE — ED Notes (Signed)
See triage note  Presents with a 1 week hx of h/a  States pain is on right side behind eye and at temporal area    States he has tried OTC meds w/o relief   States pain is decreased with compresses  No fever,n/v or trauma

## 2019-12-13 NOTE — Discharge Instructions (Addendum)
Follow-up with one of the clinics listed on your discharge papers.  You will need to call to see if they are taking new patients and make an appointment.  Also the [facility] is available to you and their information is listed on your discharge papers.  Begin taking Fioricet 1 every 6 hours if needed for headache.  Do not drive or operate machinery while taking this medication.  Drink lots of fluids.  Your CT scan today does not show any life-threatening changes such as brain tumor or bleeding in the brain.

## 2019-12-13 NOTE — ED Triage Notes (Signed)
Pt presents to ED via POV with c/o R side headaches x 1 week. Pt states has been taking Advil for pain without relief. Pt states pain has been increasing. Pt presents A&O x 4, pt states hx of migraines.

## 2020-04-25 ENCOUNTER — Telehealth: Payer: Self-pay | Admitting: General Practice

## 2020-04-25 NOTE — Telephone Encounter (Signed)
Attempts have been made in efforts to contact individual regarding ED referral. Phone number is not in service.

## 2020-07-23 IMAGING — CT CT HEAD W/O CM
3 series · 16 of 47 positions shown, 19 images · non-contrast
Comparison: None.

CLINICAL DATA: Right-sided headache for 1 week

EXAM:
CT HEAD WITHOUT CONTRAST
TECHNIQUE: Contiguous axial images were obtained from the base of the skull
through the vertex without intravenous contrast.

[Series 2: head wo · axial · 0.47mm/px · z∈[-148,-23]mm · 10 of 30 slices shown, 13 images]
[im 3/30  brain]
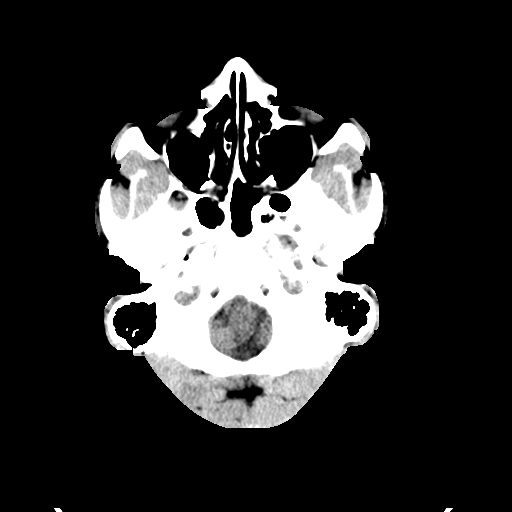
[im 3/30  bone]
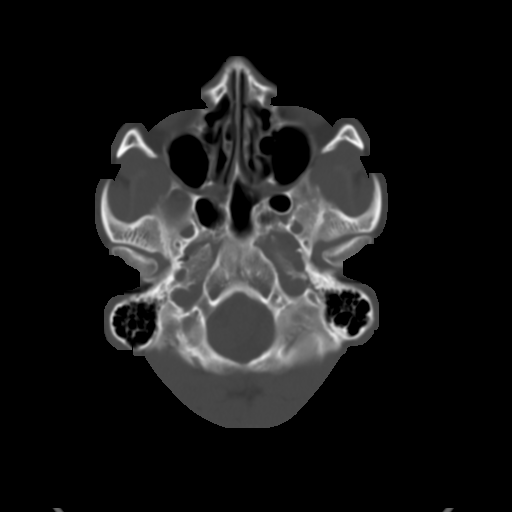
[im 6/30  brain]
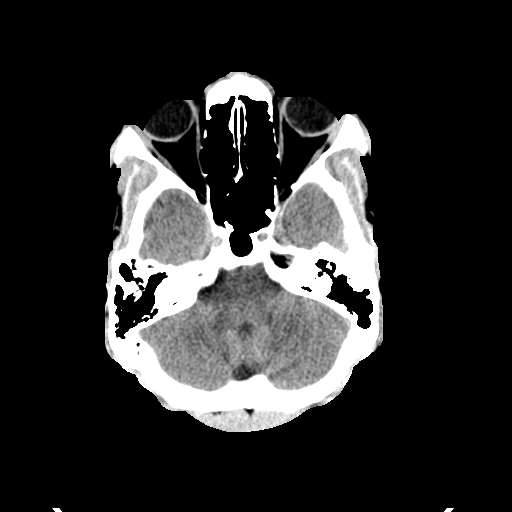
[im 9/30  brain]
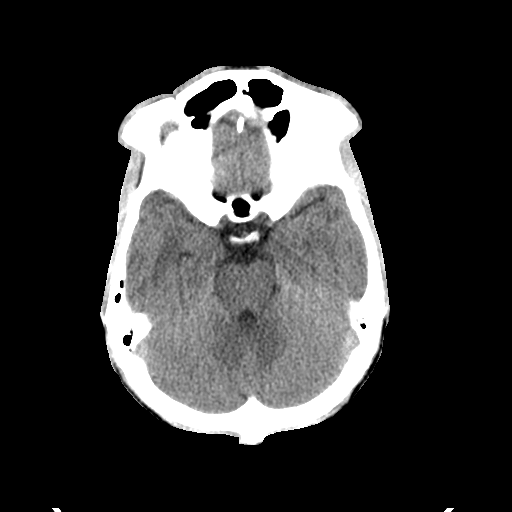
[im 11/30  brain]
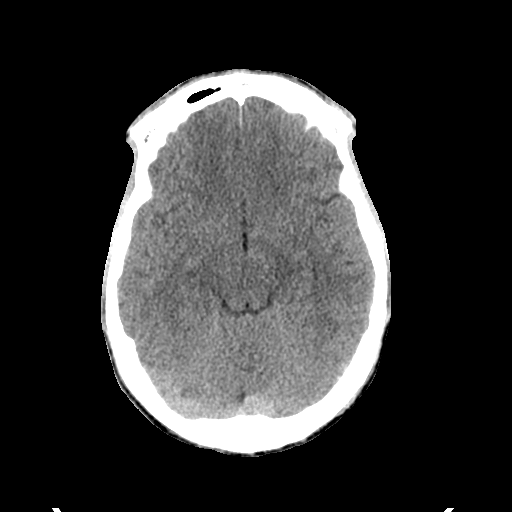
[im 14/30  brain]
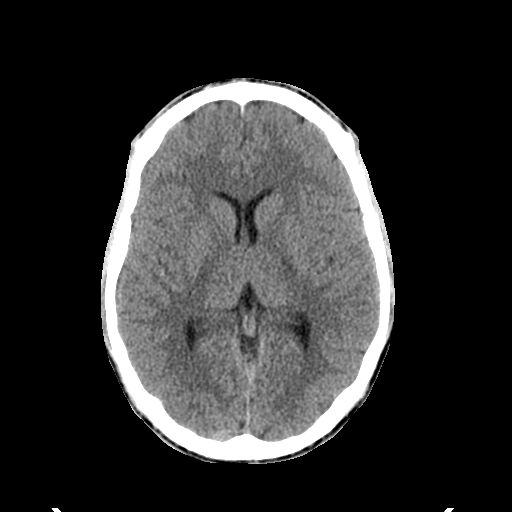
[im 14/30  bone]
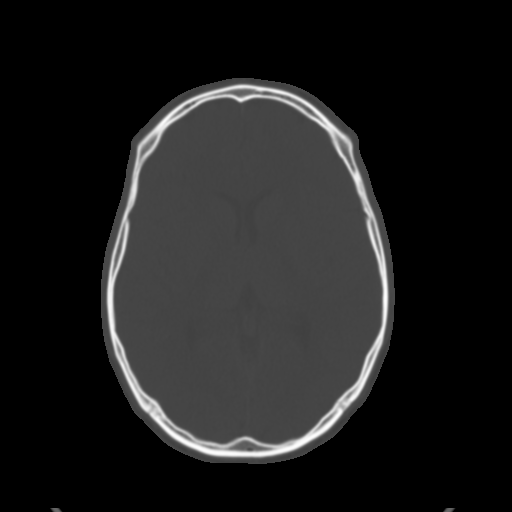
[im 17/30  brain]
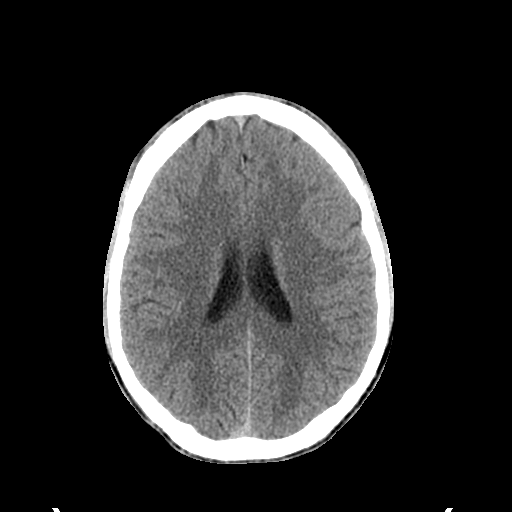
[im 20/30  brain]
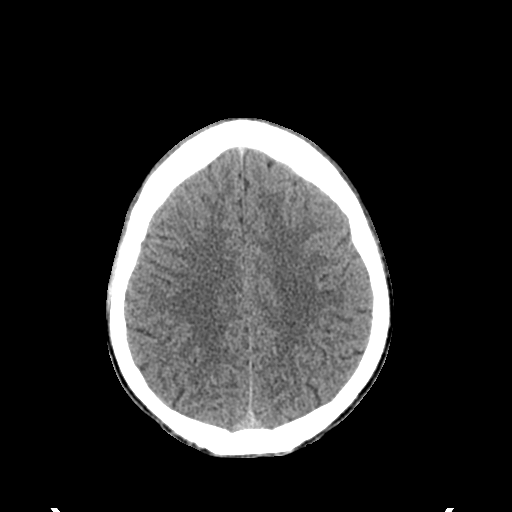
[im 23/30  brain]
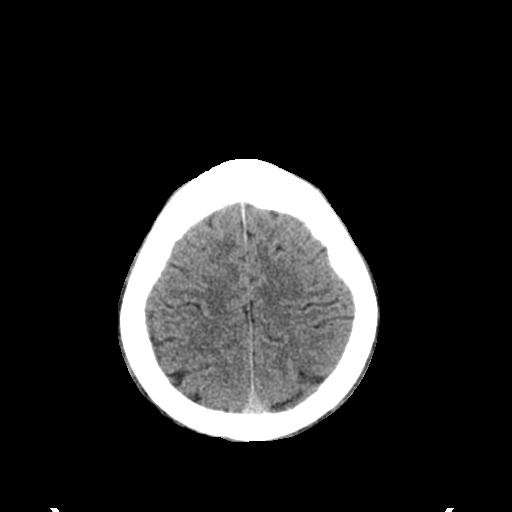
[im 25/30  brain]
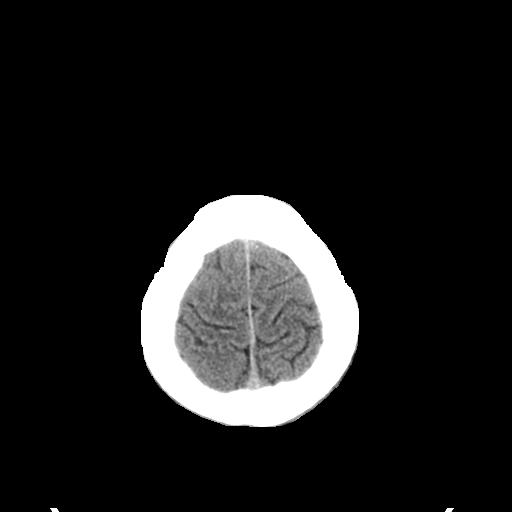
[im 25/30  bone]
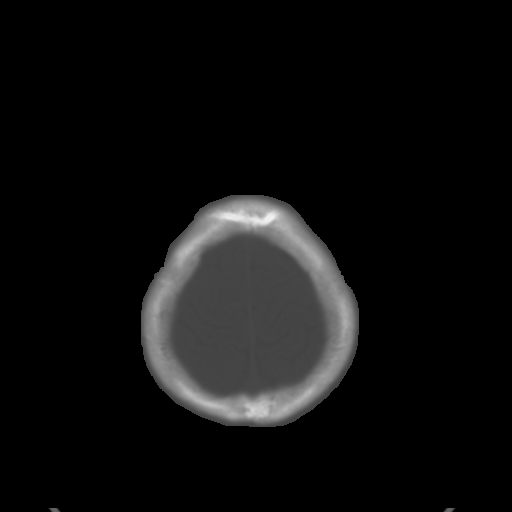
[im 28/30  brain]
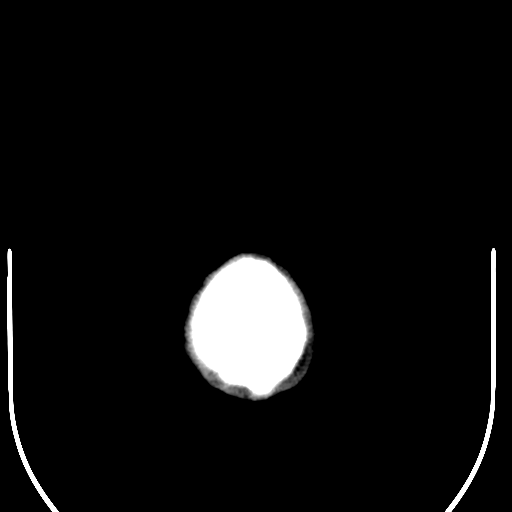

[Series 4: coronal soft tissue · coronal · 0.29mm/px · 3 of 65 slices shown]
[im 22/65  brain]
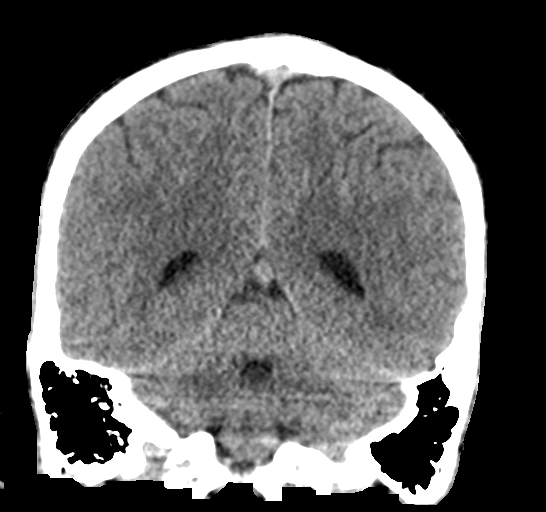
[im 29/65  brain]
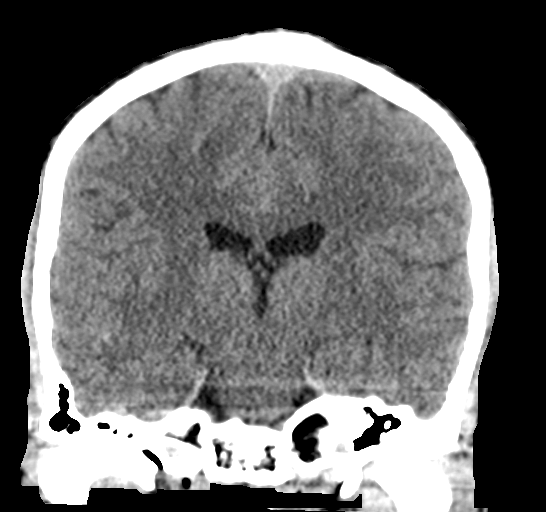
[im 36/65  brain]
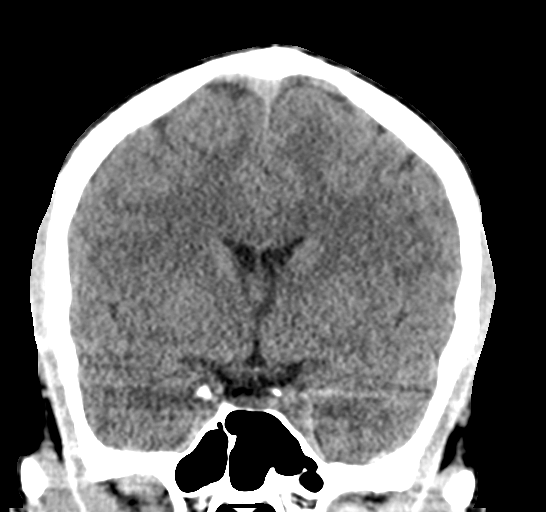

[Series 5: sagittal soft tissue · sagittal · 0.29mm/px · 3 of 54 slices shown]
[im 18/54  brain]
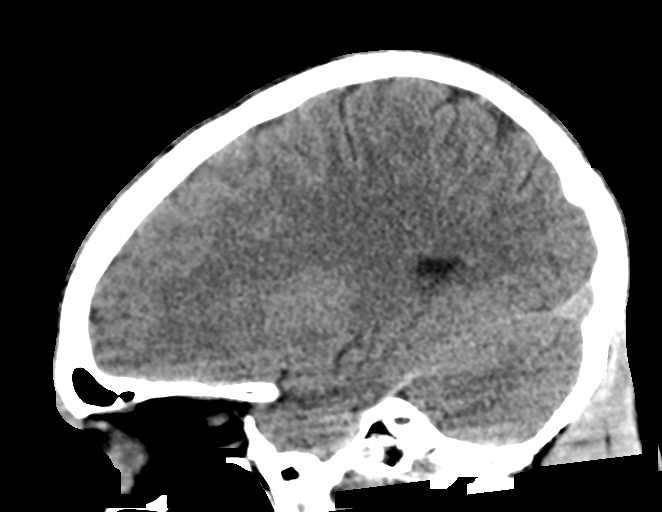
[im 27/54  brain]
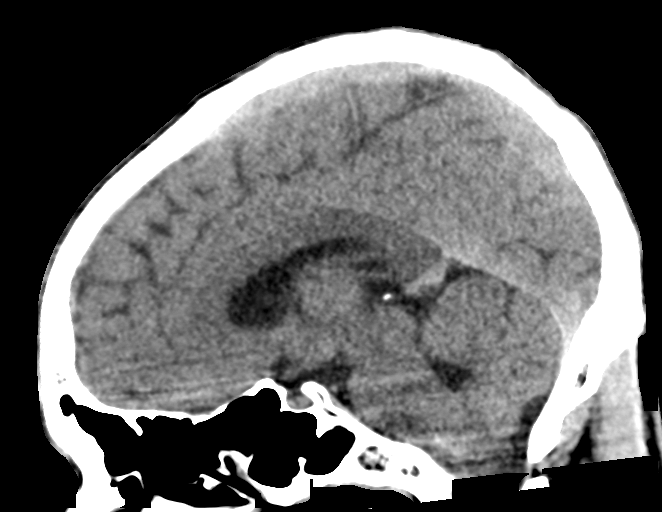
[im 36/54  brain]
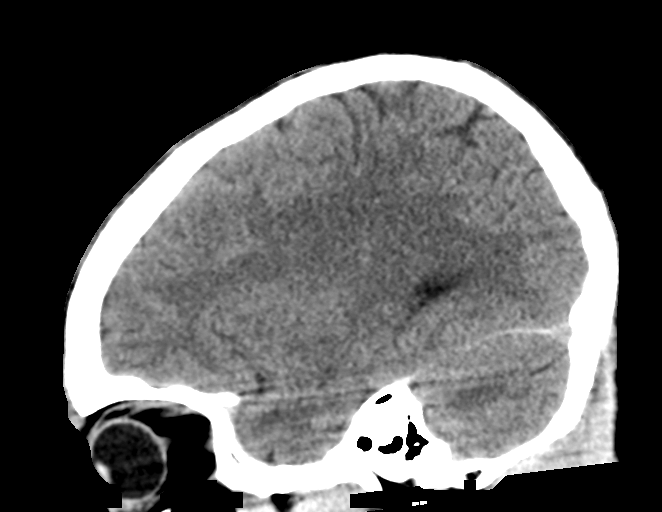

[16 of 47 positions shown; findings below may reference images not displayed]

FINDINGS: Brain: No evidence of acute infarction, hemorrhage, hydrocephalus,
extra-axial collection or mass lesion/mass effect.

Vascular: No hyperdense vessel or unexpected calcification.

Skull: Normal. Negative for fracture or focal lesion.

Sinuses/Orbits: No acute finding.

Other: None.
IMPRESSION: No acute intracranial pathology. No non-contrast CT findings to
explain headache.

## 2023-10-25 ENCOUNTER — Emergency Department
Admission: EM | Admit: 2023-10-25 | Discharge: 2023-10-25 | Disposition: A | Discharge status: Home / Self Care | Payer: Self-pay | Attending: Emergency Medicine | Admitting: Emergency Medicine

## 2023-10-25 ENCOUNTER — Other Ambulatory Visit: Payer: Self-pay

## 2023-10-25 DIAGNOSIS — R519 Headache, unspecified: Secondary | ICD-10-CM | POA: Insufficient documentation

## 2023-10-25 MED ORDER — METOCLOPRAMIDE HCL 10 MG PO TABS
10.0000 mg | ORAL_TABLET | Freq: Once | ORAL | Status: AC
  Administered 2023-10-25: 10 mg via ORAL
  Filled 2023-10-25: qty 1

## 2023-10-25 MED ORDER — DIPHENHYDRAMINE HCL 25 MG PO CAPS
25.0000 mg | ORAL_CAPSULE | Freq: Once | ORAL | Status: AC
  Administered 2023-10-25: 25 mg via ORAL
  Filled 2023-10-25: qty 1

## 2023-10-25 MED ORDER — METOCLOPRAMIDE HCL 10 MG PO TABS: 10.0000 mg | ORAL_TABLET | Freq: Three times a day (TID) | ORAL | 0 refills | Status: AC

## 2023-10-25 MED ORDER — ACETAMINOPHEN 325 MG PO TABS
650.0000 mg | ORAL_TABLET | Freq: Once | ORAL | Status: AC
  Administered 2023-10-25: 650 mg via ORAL
  Filled 2023-10-25: qty 2

## 2023-10-25 NOTE — ED Provider Notes (Signed)
[facility] Emergency Department Provider Note     Event Date/Time   First MD Initiated Contact with Patient 10/25/23 1758     (approximate)   History   Migraine   HPI  Craig Ritter. is a 27 y.o. male with a self-proclaimed history of migraines presents to the ED for evaluation of of a left-sided headache for the last 5 days.  Patient denies any preceding injury, trauma, fall.  No fevers, chills, sweats.  No vision changes, tinnitus, vertigo, hearing loss, or syncope.  No distal paresthesias facial droop, or slurred speech.  He has taken [facility] powders sporadically with limited benefit.  Patient denies any history of migraine diagnosis or current or remote treatment for said history.  Physical Exam   Triage Vital Signs: ED Triage Vitals  Encounter Vitals Group     BP 10/25/23 1443 (!) 119/52     Systolic BP Percentile --      Diastolic BP Percentile --      Pulse Rate 10/25/23 1443 66     Resp 10/25/23 1443 16     Temp 10/25/23 1443 97.8 F (36.6 C)     Temp Source 10/25/23 1443 Oral     SpO2 10/25/23 1443 100 %     Weight 10/25/23 1442 120 lb (54.4 kg)     Height 10/25/23 1442 5\' 3"  (1.6 m)     Head Circumference --      Peak Flow --      Pain Score 10/25/23 1443 10     Pain Loc --      Pain Education --      Exclude from Growth Chart --     Most recent vital signs: Vitals:   10/25/23 1443  BP: (!) 119/52  Pulse: 66  Resp: 16  Temp: 97.8 F (36.6 C)  SpO2: 100%    General Awake, no distress. NAD.  Watching reels on his cell phone HEENT NCAT. PERRL. EOMI. No rhinorrhea. Mucous membranes are moist.  CV:  Good peripheral perfusion. RRR RESP:  Normal effort. CTA NEURO: Cranial nerves II to XII grossly intact   ED Results / Procedures / Treatments   Labs (all labs ordered are listed, but only abnormal results are displayed) Labs Reviewed - No data to display   EKG   RADIOLOGY No results  found.   PROCEDURES:  Critical Care performed: No  Procedures   MEDICATIONS ORDERED IN ED: Medications  acetaminophen (TYLENOL) tablet 650 mg (has no administration in time range)  diphenhydrAMINE (BENADRYL) capsule 25 mg (has no administration in time range)  metoCLOPramide (REGLAN) tablet 10 mg (has no administration in time range)     IMPRESSION / MDM / ASSESSMENT AND PLAN / ED COURSE  I reviewed the triage vital signs and the nursing notes.                              Differential diagnosis includes, but is not limited to, intracranial hemorrhage, meningitis/encephalitis, previous head trauma, cavernous venous thrombosis, tension headache, temporal arteritis, migraine or migraine equivalent, idiopathic intracranial hypertension, and non-specific headache.  Patient's presentation is most consistent with acute, uncomplicated illness.  Patient's diagnosis is consistent with generalized headache without known cause.  Patient with no red flags on exam.  No acute neuro muscle deficits appreciated.  He is stable on exam.  Patient be treated in the ED with a course medications including Tylenol, Benadryl,  and Reglan.  Patient will be discharged home with prescriptions for Reglan to take with OTC Tylenol and Motrin as well as Benadryl for additional headache pain relief. Patient is to follow up with his primary care provider, a local urgent care, or community clinic as discussed, as needed or otherwise directed. Patient is given ED precautions to return to the ED for any worsening or new symptoms.  FINAL CLINICAL IMPRESSION(S) / ED DIAGNOSES   Final diagnoses:  Bad headache     Rx / DC Orders   ED Discharge Orders     None        Note:  This document was prepared using Dragon voice recognition software and may include unintentional dictation errors.    Lissa Hoard, PA-C 10/25/23 1828

## 2023-10-25 NOTE — Discharge Instructions (Signed)
Your exam is reassuring at this time.  Take the prescription nausea medicine along with OTC Tylenol and Motrin for additional headache pain relief.  Follow-up with your primary provider for ongoing concerns.

## 2023-10-25 NOTE — ED Triage Notes (Signed)
Pt to ED for migraine since 5 days. Endorses photosensitivity. Pt watching movie on phone.
